# Patient Record
Sex: Male | Born: 1980 | Race: Black or African American | Hispanic: No | Marital: Single | Smoking: Current every day smoker
Health system: Southern US, Community
[De-identification: ages and names within clinical notes are randomized; demographics above are authoritative.]

---

## 2009-05-29 ENCOUNTER — Emergency Department: Payer: Self-pay | Admitting: Emergency Medicine

## 2009-06-05 ENCOUNTER — Emergency Department: Payer: Self-pay | Admitting: Emergency Medicine

## 2013-08-20 ENCOUNTER — Emergency Department (HOSPITAL_COMMUNITY): Payer: Self-pay

## 2013-08-20 ENCOUNTER — Encounter (HOSPITAL_COMMUNITY): Payer: Self-pay | Admitting: Emergency Medicine

## 2013-08-20 ENCOUNTER — Emergency Department (HOSPITAL_COMMUNITY)
Admission: EM | Admit: 2013-08-20 | Discharge: 2013-08-20 | Disposition: A | Discharge status: Home / Self Care | Payer: Self-pay | Attending: Emergency Medicine | Admitting: Emergency Medicine

## 2013-08-20 DIAGNOSIS — S82839A Other fracture of upper and lower end of unspecified fibula, initial encounter for closed fracture: Secondary | ICD-10-CM | POA: Insufficient documentation

## 2013-08-20 DIAGNOSIS — R296 Repeated falls: Secondary | ICD-10-CM | POA: Insufficient documentation

## 2013-08-20 DIAGNOSIS — F17200 Nicotine dependence, unspecified, uncomplicated: Secondary | ICD-10-CM | POA: Insufficient documentation

## 2013-08-20 DIAGNOSIS — S82409A Unspecified fracture of shaft of unspecified fibula, initial encounter for closed fracture: Secondary | ICD-10-CM

## 2013-08-20 DIAGNOSIS — Y9239 Other specified sports and athletic area as the place of occurrence of the external cause: Secondary | ICD-10-CM | POA: Insufficient documentation

## 2013-08-20 DIAGNOSIS — Y9361 Activity, american tackle football: Secondary | ICD-10-CM | POA: Insufficient documentation

## 2013-08-20 DIAGNOSIS — Y92838 Other recreation area as the place of occurrence of the external cause: Secondary | ICD-10-CM

## 2013-08-20 MED ORDER — IBUPROFEN 800 MG PO TABS
800.0000 mg | ORAL_TABLET | Freq: Once | ORAL | Status: AC
  Administered 2013-08-20: 800 mg via ORAL
  Filled 2013-08-20: qty 1

## 2013-08-20 MED ORDER — OXYCODONE-ACETAMINOPHEN 5-325 MG PO TABS
1.0000 | ORAL_TABLET | Freq: Once | ORAL | Status: AC
  Administered 2013-08-20: 1 via ORAL
  Filled 2013-08-20: qty 1

## 2013-08-20 MED ORDER — OXYCODONE-ACETAMINOPHEN 5-325 MG PO TABS: 1.0000 | ORAL_TABLET | Freq: Four times a day (QID) | ORAL | Status: DC | PRN

## 2013-08-20 MED ORDER — IBUPROFEN 800 MG PO TABS: 800.0000 mg | ORAL_TABLET | Freq: Three times a day (TID) | ORAL | Status: DC

## 2013-08-20 NOTE — ED Notes (Signed)
Pt reports fell playing football yesterday.  C/O pain to left knee.

## 2013-08-20 NOTE — Discharge Instructions (Signed)
You have a fracture of your fibula just below your left knee. It is important that you keep your leg elevated above your waist. Please apply ice. Please use crutches when up and about. Please see Dr. Romeo AppleHarrison for evaluation and management of this fracture as sone as possible. Please use ibuprofen 3 times daily with food. Use Percocet for pain if needed. This medication may cause drowsiness, please use with caution.

## 2013-08-20 NOTE — ED Provider Notes (Signed)
CSN: [id number]     Arrival date & time 08/20/13  1156 History  This chart was scribed for Tony QualeHobson Abegail Kloeppel, PA-C, working with Donnetta HutchingBrian Cook, MD, by [facility] ED Scribe. This patient was seen in room APFT20/APFT20 and the patient's care was started at 1:19 PM.   Chief Complaint  Patient presents with  . Knee Pain    The history is provided by the patient. No language interpreter was used.    HPI Comments: Tony Fields is a 33 y.o. male who presents to the Emergency Department complaining of a left knee injury that occurred yesterday. He states that the injury occurred while playing football, when he was coming down from a jump, and he fell, twisting his left knee. He is complaining of constant, moderate pain onset after the injury. He reports an associated gradual onset of left knee swelling. He states that he did not sustain any other injuries. He denies any previous operations or procedures. He states that he is not on any anticoagulants.    History reviewed. No pertinent past medical history. History reviewed. No pertinent past surgical history. No family history on file. History  Substance Use Topics  . Smoking status: Current Every Day Smoker  . Smokeless tobacco: Not on file  . Alcohol Use: Yes     Comment: daily    Review of Systems  Musculoskeletal: Positive for arthralgias (left knee) and joint swelling (left knee). Negative for back pain and neck pain.  Neurological: Negative for syncope and headaches.  All other systems reviewed and are negative.  Allergies  Pineapple  Home Medications   Prior to Admission medications   Not on File   Triage Vitals: BP 142/84  Pulse 84  Temp(Src) 98 F (36.7 C) (Oral)  Resp 18  SpO2 99%  Physical Exam  Nursing note and vitals reviewed. Constitutional: He is oriented to person, place, and time. He appears well-developed and well-nourished. No distress.  HENT:  Head: Normocephalic and atraumatic.  Eyes: EOM are normal.   Neck: Neck supple. No tracheal deviation present.  Cardiovascular: Normal rate and regular rhythm.   Pulmonary/Chest: Effort normal and breath sounds normal. No respiratory distress.  Musculoskeletal: He exhibits tenderness.  LLE: No deformity of the quadriceps area. Moderate effusion of the left knee. Patella is midline. No deformity of the anterior tibial tuberosity. Tenderness to the posterior knee and fibula area. No tenderness to tibia. Full ROM of the ankle. Achilles tendon is intact.  Neurological: He is alert and oriented to person, place, and time.  Skin: Skin is warm and dry.  Psychiatric: He has a normal mood and affect. His behavior is normal.    ED Course FRACTURE CARE: Patient was playing football on yesterday may 16 when he fell and injured the left knee and leg. The patient sustained a fracture of the fibula at the proximal area.  Patient identified by arm band. Permission for the procedure given by the patient. The fracture was reviewed with the patient and explained in terms which he understood. Questions were answered. The patient was fitted with a knee immobilizer. Crutches were ordered. An ice pack was provided. Prescription for Percocet and ibuprofen was given to the patient. Patient tolerated the procedure without problem.   Procedures (including critical care time)  DIAGNOSTIC STUDIES: Oxygen Saturation is 99% on RA, normal by my interpretation.    COORDINATION OF CARE: 1:25 PM- Discussed radiology findings, indicating a left fibular fracture. Pt advised of plan for treatment and pt agrees.  Labs Review Labs Reviewed - No data to display  Imaging Review Dg Knee Complete 4 Views Left  08/20/2013   CLINICAL DATA:  Football injury  EXAM: LEFT KNEE - COMPLETE 4+ VIEW  COMPARISON:  None.  FINDINGS: Acute minimally displaced fracture of the proximal fibula extends into the articulation between the fibula and tibia. Patella, femur, and tibia are intact.  IMPRESSION:  Minimally displaced acute fibular fracture.   Electronically Signed   By: Tony Fields  Tony FieldsD.   On: 08/20/2013 13:03     EKG Interpretation None      MDM X-ray of the left knee reveals a minimally displaced fracture of the proximal fibula. Patient was fitted with a knee immobilizer, crutches, and ice. Prescription for Percocet one every 6 hours and ibuprofen 1 tab 3 times daily given to the patient. Patient will followup with Dr. Romeo Fields. He is invited to return to the emergency department if any changes or problems.    Final diagnoses:  Fibula fracture    **I have reviewed nursing notes, vital signs, and all appropriate lab and imaging results for this patient.*   **I personally performed the services described in this documentation, which was scribed in my presence. The recorded information has been reviewed and is accurate.Kathie Dike*  Tahnee Cifuentes M Merrick Maggio, PA-C 08/21/13 2034

## 2013-08-24 ENCOUNTER — Ambulatory Visit (INDEPENDENT_AMBULATORY_CARE_PROVIDER_SITE_OTHER): Payer: Self-pay | Admitting: Orthopedic Surgery

## 2013-08-24 ENCOUNTER — Encounter: Payer: Self-pay | Admitting: Orthopedic Surgery

## 2013-08-24 VITALS — BP 157/98 | Ht 75.0 in

## 2013-08-24 DIAGNOSIS — S8990XA Unspecified injury of unspecified lower leg, initial encounter: Secondary | ICD-10-CM

## 2013-08-24 DIAGNOSIS — S99929A Unspecified injury of unspecified foot, initial encounter: Secondary | ICD-10-CM

## 2013-08-24 DIAGNOSIS — S99919A Unspecified injury of unspecified ankle, initial encounter: Secondary | ICD-10-CM

## 2013-08-24 NOTE — Patient Instructions (Addendum)
Remove brace 3 times a day and bend knee 20 times each time OOW note

## 2013-08-24 NOTE — Progress Notes (Signed)
Patient ID: Tony RileyJason L Fields, male   DOB: 08/24/1980, 33 y.o.   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 He jumped landed felt lateral knee pain couldn't walk for a couple of days had acute lateral knee pain which has gotten a little bit better with ibuprofen oxycodone his been icing and wearing a knee brace  He has a clean surgical history no medical problems no current medications other than stated for this injury has no allergies he does have a family history of heart disease heart attack hypertension stroke and diabetes his family history indicates mother and father and siblings are alive there is a family history as stated of diabetes  System review active dental issues swelling of the leg related to the injury heartburn. Joint and limb pain related to the injury. Psoriasis. Balance problems numbness tingling  Vital signs: BP 157/98  Ht 6\' 3"  (1.905 m)   General the patient is well-developed and well-nourished grooming and hygiene are normal Oriented x3 Mood and affect normal Ambulation supported by crutches and by knee immobilizer with partial weightbearing Inspection of the left knee reveals joint effusion decreased range of motion only to 20 passive motion and pain doesn't have tenderness around the knee joint over the fibula the knee feels stable is normal skin is clean dry and intact   Cardiovascular exam is normal Sensory exam normal  Fibula fracture proximal with knee effusion  Continue ice start range of motion exercises continue brace and return in a week reexamination

## 2013-08-30 NOTE — ED Provider Notes (Signed)
Medical screening examination/treatment/procedure(s) were performed by non-physician practitioner and as supervising physician I was immediately available for consultation/collaboration.   EKG Interpretation None       Treyton Slimp, MD 08/30/13 1812 

## 2013-08-31 ENCOUNTER — Encounter: Payer: Self-pay | Admitting: Orthopedic Surgery

## 2013-08-31 ENCOUNTER — Ambulatory Visit (INDEPENDENT_AMBULATORY_CARE_PROVIDER_SITE_OTHER): Payer: Self-pay | Admitting: Orthopedic Surgery

## 2013-08-31 VITALS — BP 151/98 | Ht 75.0 in | Wt 220.0 lb

## 2013-08-31 DIAGNOSIS — M25462 Effusion, left knee: Secondary | ICD-10-CM | POA: Insufficient documentation

## 2013-08-31 DIAGNOSIS — M25469 Effusion, unspecified knee: Secondary | ICD-10-CM

## 2013-08-31 DIAGNOSIS — S99919A Unspecified injury of unspecified ankle, initial encounter: Secondary | ICD-10-CM

## 2013-08-31 DIAGNOSIS — S82839A Other fracture of upper and lower end of unspecified fibula, initial encounter for closed fracture: Secondary | ICD-10-CM

## 2013-08-31 DIAGNOSIS — S82832A Other fracture of upper and lower end of left fibula, initial encounter for closed fracture: Secondary | ICD-10-CM

## 2013-08-31 DIAGNOSIS — S8990XA Unspecified injury of unspecified lower leg, initial encounter: Secondary | ICD-10-CM

## 2013-08-31 DIAGNOSIS — S99929A Unspecified injury of unspecified foot, initial encounter: Secondary | ICD-10-CM

## 2013-08-31 MED ORDER — OXYCODONE-ACETAMINOPHEN 5-325 MG PO TABS: 1.0000 | ORAL_TABLET | Freq: Four times a day (QID) | ORAL | Status: DC | PRN

## 2013-08-31 NOTE — Patient Instructions (Addendum)
Brace off   Take it easy for 2 weeks   Return to activities as tolerated   Brace is fine

## 2013-08-31 NOTE — Progress Notes (Signed)
Patient ID: GOLAN SEGO, male   DOB: 02/28/1981, 33 y.o.   MRN: [id number]  Chief Complaint  Patient presents with  . Follow-up    one week recheck left fibular fracture DOI 08/19/13    Encounter Diagnoses  Name Primary?  . Knee injury Yes  . Fracture of left proximal fibula   . Effusion of knee joint, left     Status post left proximal fibular fracture secondary to football injury. Treated with immobilization and Percocet and crutches.  We started him on a home exercise program which she has done well with.  BP 151/98  Ht 6\' 3"  (1.905 m)  Wt 220 lb (99.791 kg)  BMI 27.50 kg/m2 General appearance is normal, the patient is alert and oriented x3 with normal mood and affect.  Today his knee looks good he does have an effusion which is needing to be drained. His knee flexion is returned about 120 he has good extension. Collateral ligaments are stable has some mild tenderness over the fibula   Aspiration left knee  Verbal consent  Time out  Lateral approach  Alcohol prep.  Ethyl chloride anesthesia  Needle was introduced through the lateral suprapatellar approach.  We aspirated 30 cc of red tinged fluid  No complications

## 2014-12-04 IMAGING — CR DG KNEE COMPLETE 4+V*L*
4 series · 4 of 4 positions shown · non-contrast
Comparison: None.

CLINICAL DATA: Football injury

EXAM:
LEFT KNEE - COMPLETE 4+ VIEW

[view not recorded (1 of 4)]
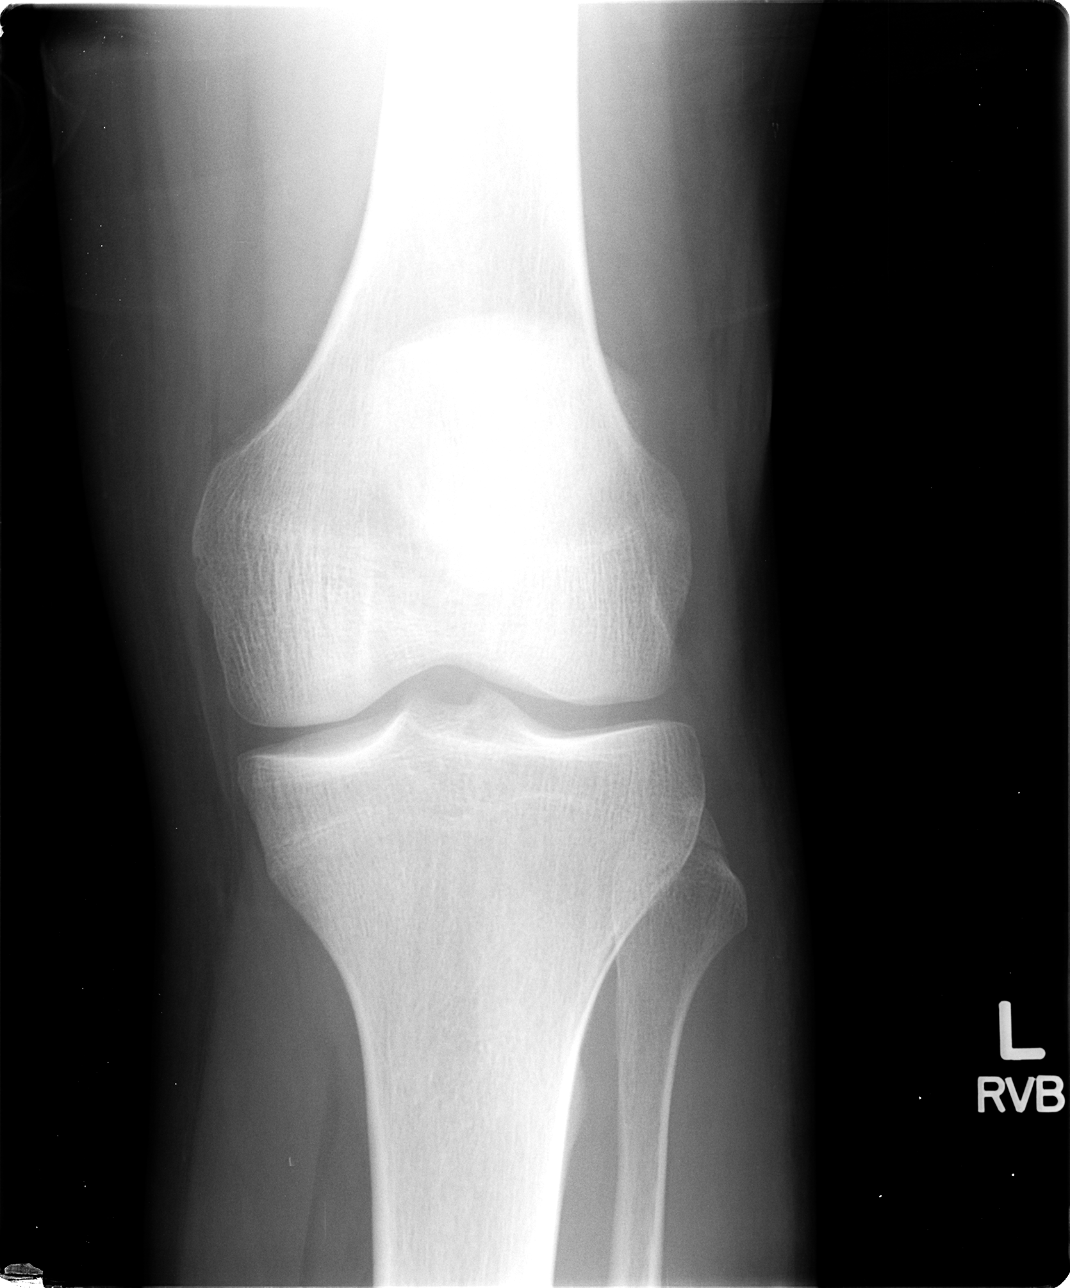

[view not recorded (2 of 4)]
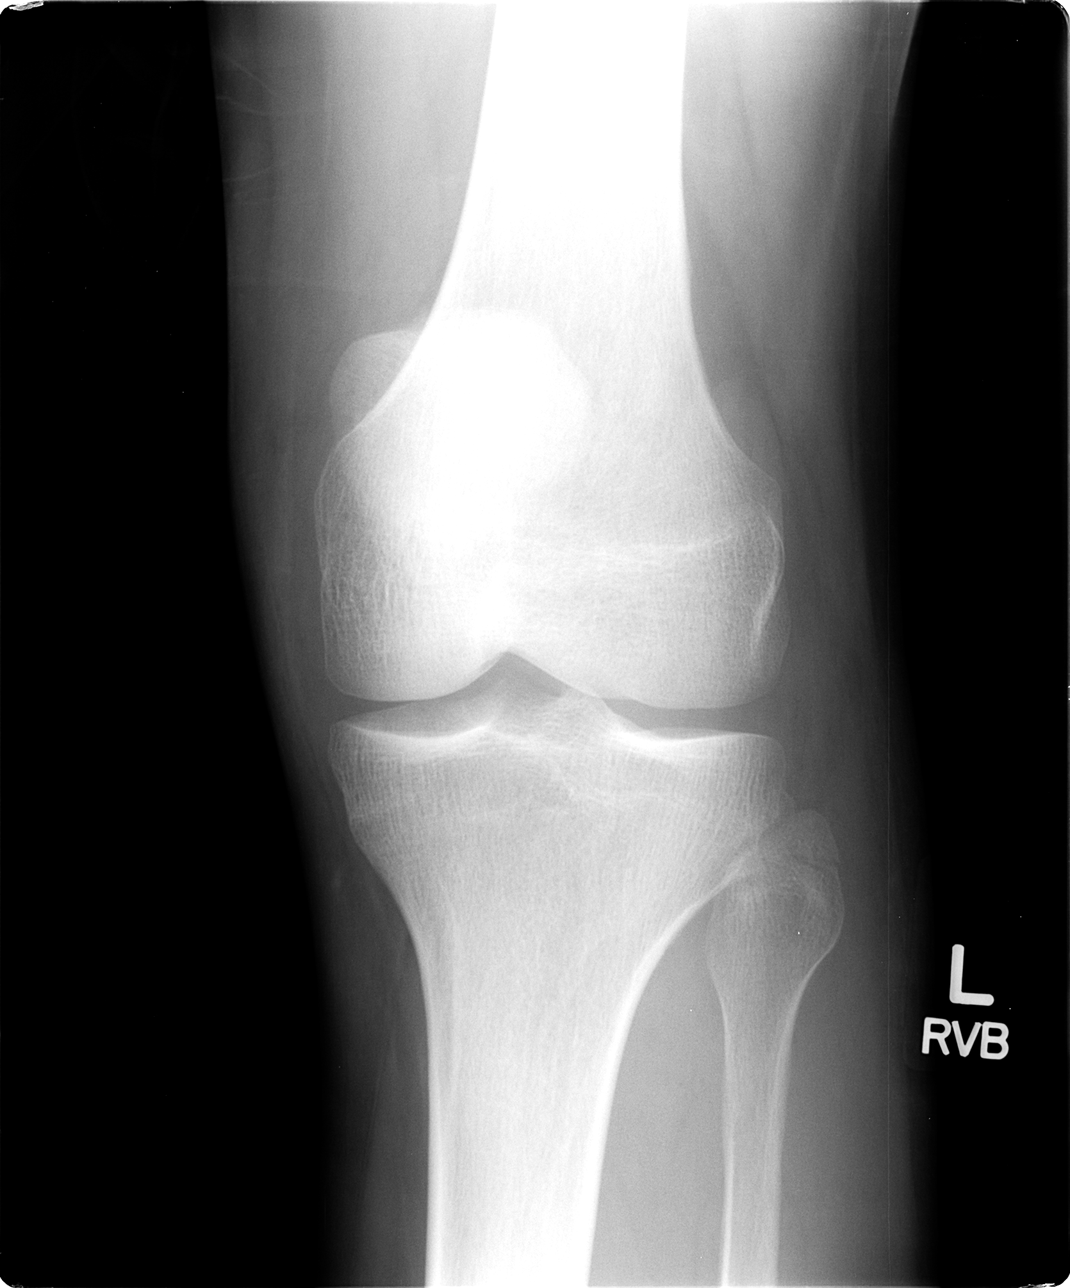

[view not recorded (3 of 4)]
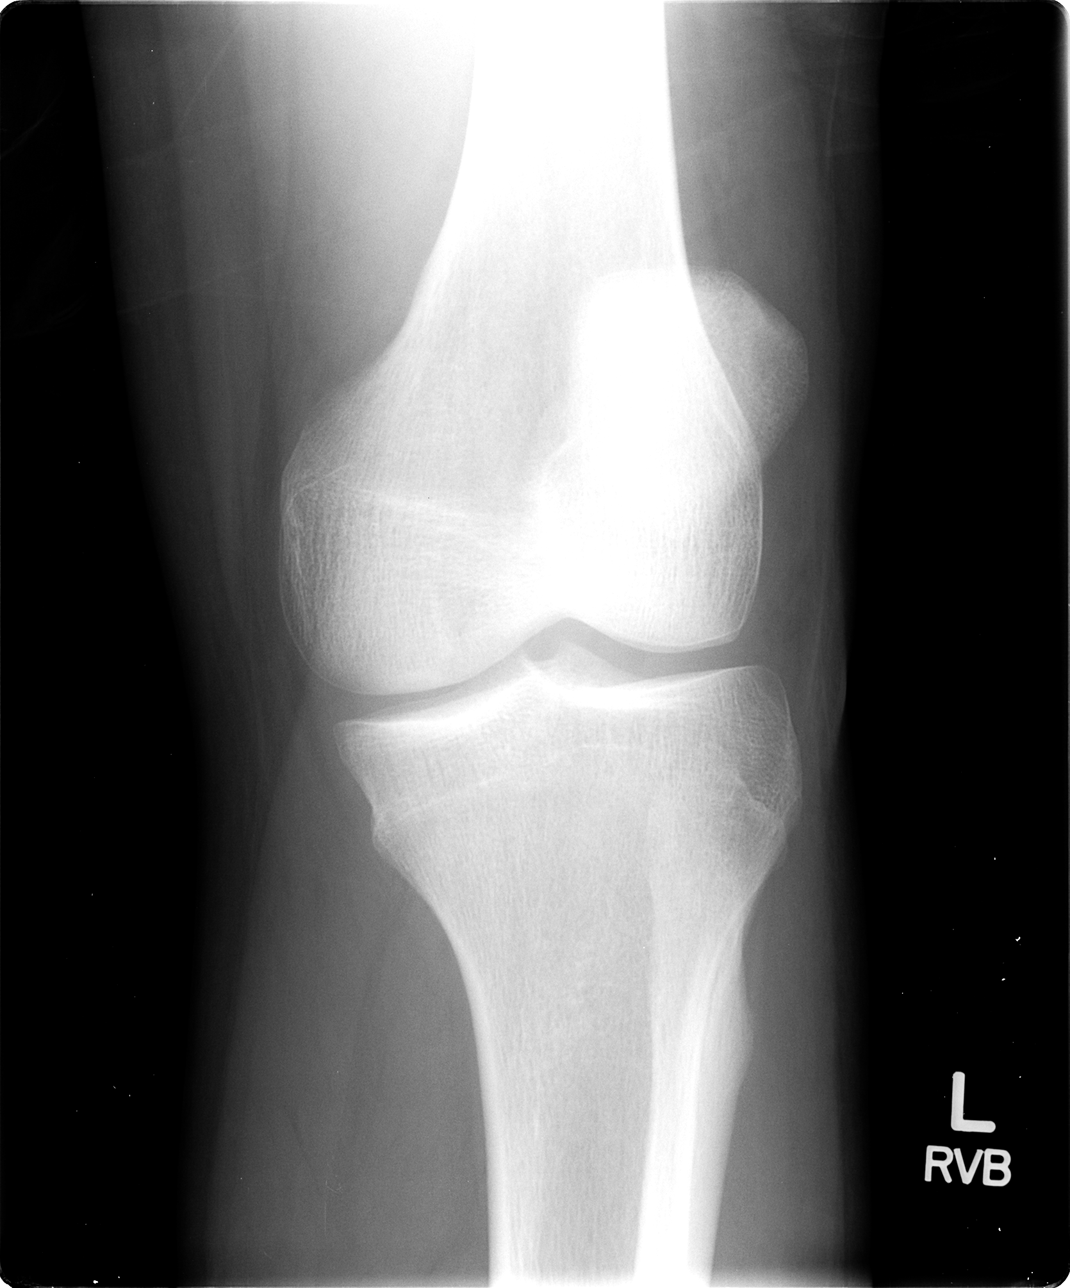

[view not recorded (4 of 4)]
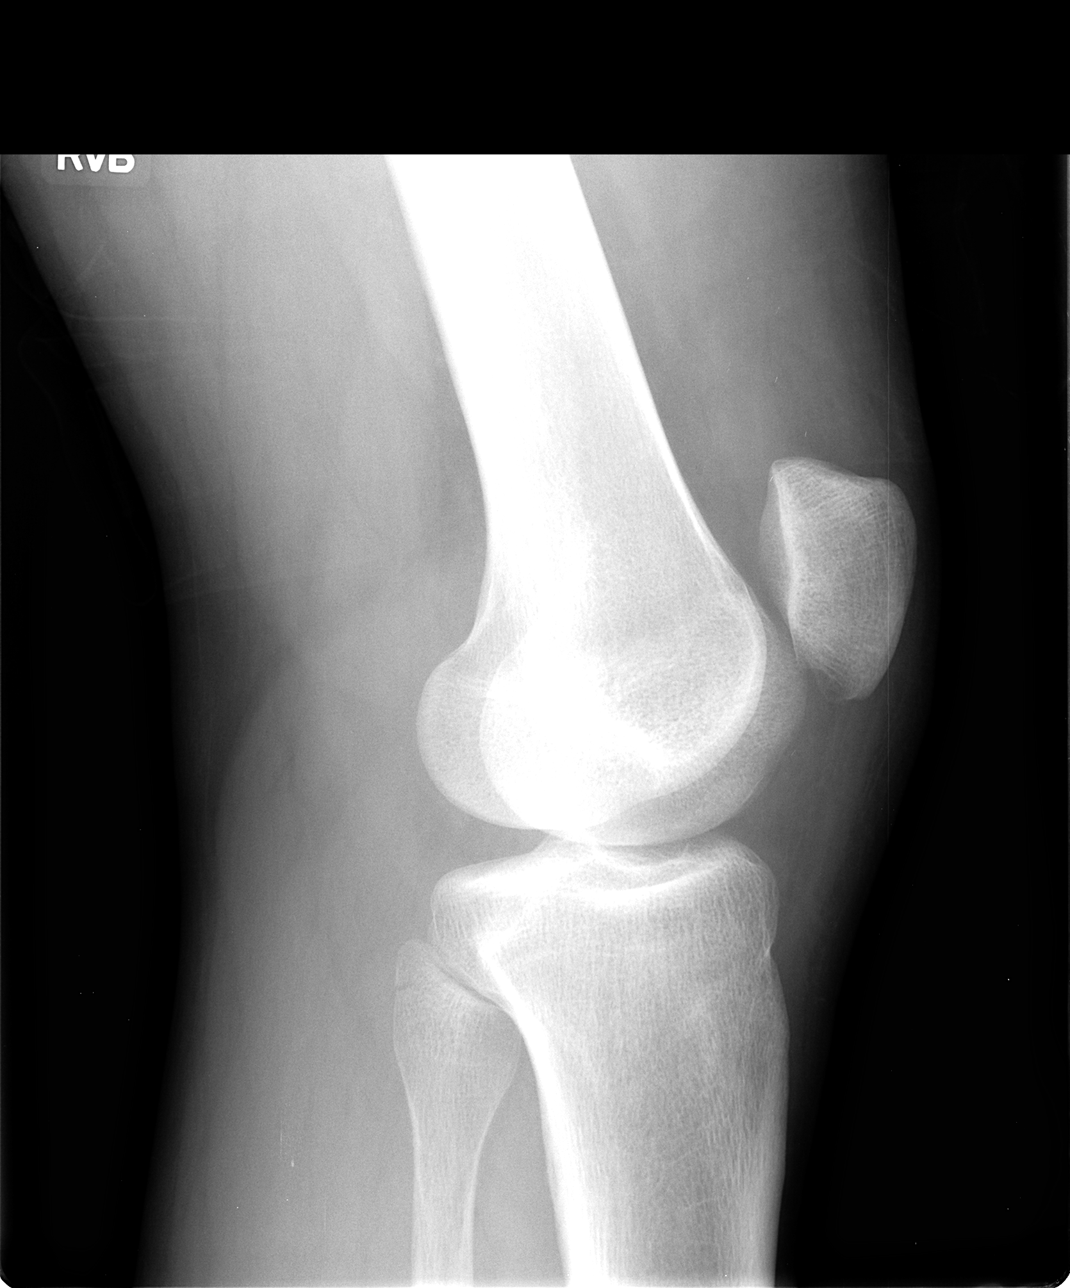

[4 of 4 positions shown; findings below may reference images not displayed]

FINDINGS: Acute minimally displaced fracture of the proximal fibula extends
into the articulation between the fibula and tibia. Patella, femur,
and tibia are intact.
IMPRESSION: Minimally displaced acute fibular fracture.

## 2015-09-08 ENCOUNTER — Emergency Department
Admission: EM | Admit: 2015-09-08 | Discharge: 2015-09-08 | Disposition: A | Discharge status: Home / Self Care | Payer: Self-pay | Attending: Emergency Medicine | Admitting: Emergency Medicine

## 2015-09-08 ENCOUNTER — Encounter: Payer: Self-pay | Admitting: Medical Oncology

## 2015-09-08 DIAGNOSIS — F17200 Nicotine dependence, unspecified, uncomplicated: Secondary | ICD-10-CM | POA: Insufficient documentation

## 2015-09-08 DIAGNOSIS — Z79899 Other long term (current) drug therapy: Secondary | ICD-10-CM | POA: Insufficient documentation

## 2015-09-08 DIAGNOSIS — L02215 Cutaneous abscess of perineum: Secondary | ICD-10-CM | POA: Insufficient documentation

## 2015-09-08 DIAGNOSIS — Z7982 Long term (current) use of aspirin: Secondary | ICD-10-CM | POA: Insufficient documentation

## 2015-09-08 MED ORDER — SULFAMETHOXAZOLE-TRIMETHOPRIM 800-160 MG PO TABS
1.0000 | ORAL_TABLET | Freq: Once | ORAL | Status: AC
  Administered 2015-09-08: 1 via ORAL
  Filled 2015-09-08: qty 1

## 2015-09-08 MED ORDER — BUPIVACAINE HCL (PF) 0.5 % IJ SOLN
INTRAMUSCULAR | Status: AC
  Filled 2015-09-08: qty 30

## 2015-09-08 MED ORDER — FLUORESCEIN SODIUM 1 MG OP STRP
ORAL_STRIP | OPHTHALMIC | Status: AC
  Filled 2015-09-08: qty 1

## 2015-09-08 MED ORDER — EYE WASH OPHTH SOLN
OPHTHALMIC | Status: AC
  Filled 2015-09-08: qty 118

## 2015-09-08 MED ORDER — OXYCODONE-ACETAMINOPHEN 10-325 MG PO TABS: 1.0000 | ORAL_TABLET | Freq: Four times a day (QID) | ORAL | Status: DC | PRN

## 2015-09-08 MED ORDER — OXYCODONE-ACETAMINOPHEN 5-325 MG PO TABS
2.0000 | ORAL_TABLET | Freq: Once | ORAL | Status: AC
  Administered 2015-09-08: 2 via ORAL
  Filled 2015-09-08: qty 2

## 2015-09-08 MED ORDER — NAPROXEN 500 MG PO TABS: 500.0000 mg | ORAL_TABLET | Freq: Two times a day (BID) | ORAL | Status: DC

## 2015-09-08 MED ORDER — TETRACAINE HCL 0.5 % OP SOLN
OPHTHALMIC | Status: AC
  Filled 2015-09-08: qty 2

## 2015-09-08 MED ORDER — SULFAMETHOXAZOLE-TRIMETHOPRIM 800-160 MG PO TABS: 1.0000 | ORAL_TABLET | Freq: Two times a day (BID) | ORAL | Status: DC

## 2015-09-08 NOTE — ED Notes (Signed)
Eye orders put in wrong on pt per PA. Order done on different pt. Charted in error

## 2015-09-08 NOTE — ED Notes (Signed)
Pt reports abscess to groin

## 2015-09-08 NOTE — ED Notes (Signed)
States he developed a possible area to groin 2 days ago  States he used moist heat to area  W/o relief

## 2015-09-08 NOTE — ED Provider Notes (Signed)
[facility] Emergency Department Provider Note   ____________________________________________  Time seen: Approximately 3:24 PM  I have reviewed the triage vital signs and the nursing notes.   HISTORY  Chief Complaint Abscess    HPI Tony Fields is a 35 y.o. male patient was sent for 2 days of increased nodule lesion growth in the perineal area. Patient state the pain has increased especially with ambulation. No palliative measures for this complaint. Patient's redness pain is 8/10. Patient rates pain as "sharp". Patient denies any fever or discharge from the area.   History reviewed. No pertinent past medical history.  Patient Active Problem List   Diagnosis Date Noted  . Effusion of knee joint, left 08/31/2013  . Fracture of left proximal fibula 08/31/2013  . Knee injury 08/31/2013    History reviewed. No pertinent past surgical history.  Current Outpatient Rx  Name  Route  Sig  Dispense  Refill  . aspirin-acetaminophen-caffeine (EXCEDRIN EXTRA STRENGTH) 250-250-65 MG per tablet   Oral   Take 2 tablets by mouth daily as needed (knee pain.).         Marland Kitchen. ibuprofen (ADVIL,MOTRIN) 200 MG tablet   Oral   Take 800 mg by mouth every 6 (six) hours as needed for moderate pain.         Marland Kitchen. ibuprofen (ADVIL,MOTRIN) 800 MG tablet   Oral   Take 1 tablet (800 mg total) by mouth 3 (three) times daily.   21 tablet   0   . naproxen (NAPROSYN) 500 MG tablet   Oral   Take 1 tablet (500 mg total) by mouth 2 (two) times daily with a meal.   20 tablet   00   . naproxen sodium (ANAPROX) 220 MG tablet   Oral   Take 440 mg by mouth daily as needed (back pain.).         Marland Kitchen. oxyCODONE-acetaminophen (PERCOCET) 10-325 MG tablet   Oral   Take 1 tablet by mouth every 6 (six) hours as needed for pain.   20 tablet   0   . oxyCODONE-acetaminophen (PERCOCET/ROXICET) 5-325 MG per tablet   Oral   Take 1 tablet by mouth every 6 (six) hours as needed for  severe pain.   30 tablet   0   . sulfamethoxazole-trimethoprim (BACTRIM DS,SEPTRA DS) 800-160 MG tablet   Oral   Take 1 tablet by mouth 2 (two) times daily.   20 tablet   0     Allergies Pineapple  No family history on file.  Social History Social History  Substance Use Topics  . Smoking status: Current Every Day Smoker  . Smokeless tobacco: None  . Alcohol Use: Yes     Comment: daily    Review of Systems Constitutional: No fever/chills. Patient appears anxious Eyes: No visual changes. ENT: No sore throat. Cardiovascular: Denies chest pain. Respiratory: Denies shortness of breath. Gastrointestinal: No abdominal pain.  No nausea, no vomiting.  No diarrhea.  No constipation. Genitourinary: Negative for dysuria. Musculoskeletal: Negative for back pain. Skin: Negative for rash. Erythematous nodule lesion midline perineal area. Neurological: Negative for headaches, focal weakness or numbness. Allergic/Immunilogical: Pineapple __________________________________________   PHYSICAL EXAM:  VITAL SIGNS: ED Triage Vitals  Enc Vitals Group     BP 09/08/15 1359 149/89 mmHg     Pulse Rate 09/08/15 1359 103     Resp 09/08/15 1359 18     Temp 09/08/15 1359 98.7 F (37.1 C)     Temp Source 09/08/15  1359 Oral     SpO2 09/08/15 1359 100 %     Weight 09/08/15 1359 242 lb (109.77 kg)     Height 09/08/15 1359  (1.905 m)     Head Cir --      Peak Flow --      Pain Score 09/08/15 1401 8     Pain Loc --      Pain Edu? --      Excl. in GC? --     Constitutional: Alert and oriented. Well appearing and in no acute distress. Eyes: Conjunctivae are normal. PERRL. EOMI. Head: Atraumatic. Nose: No congestion/rhinnorhea. Mouth/Throat: Mucous membranes are moist.  Oropharynx non-erythematous. Neck: No stridor.  No cervical spine tenderness to palpation. Cardiovascular: Normal rate, regular rhythm. Grossly normal heart sounds.  Good peripheral circulation. Respiratory: Normal  respiratory effort.  No retractions. Lungs CTAB. Gastrointestinal: Soft and nontender. No distention. No abdominal bruits. No CVA tenderness. Musculoskeletal: No lower extremity tenderness nor edema.  No joint effusions. Neurologic:  Normal speech and language. No gross focal neurologic deficits are appreciated. No gait instability. Skin:  Skin is warm, dry and intact. Fluctuant nausea lesion measures approximately8 centimeters perineal area  Psychiatric: Mood and affect are normal. Speech and behavior are normal.  ____________________________________________   LABS (all labs ordered are listed, but only abnormal results are displayed)  Labs Reviewed  WOUND CULTURE ([facility] ONLY)   ____________________________________________  EKG   ____________________________________________  RADIOLOGY   ____________________________________________   PROCEDURES  Procedure(s) performed: INCISION AND DRAINAGE Performed by: Joni Reining Consent: Verbal consent obtained. Risks and benefits: risks, benefits and alternatives were discussed Type: abscess  Body area:Perineum Anesthesia: local infiltration  Incision was made with a scalpel. 11 blade Sensorcaine Local anesthetic: Sensorcaine Anesthetic total:15 ml  Complexity: complex Blunt dissection to break up loculations  Drainage: purulent  Drainage amount: Large   Packing material: 1/4 in iodoform gauze  Patient tolerance: Patient tolerated the procedure well with no immediate complications.     Critical Care performed: No  ____________________________________________   INITIAL IMPRESSION / ASSESSMENT AND PLAN / ED COURSE  Pertinent labs & imaging results that were available during my care of the patient were reviewed by me and considered in my medical decision making (see chart for details).  Perineum abscess. Patient given discharge care instructions. Patient given a work note for 2 days. Patient advised to take  medication as directed. Patient return in 2 days for wound check. ____________________________________________   FINAL CLINICAL IMPRESSION(S) / ED DIAGNOSES  Final diagnoses:  Abscess of perineum      NEW MEDICATIONS STARTED DURING THIS VISIT:  New Prescriptions   NAPROXEN (NAPROSYN) 500 MG TABLET    Take 1 tablet (500 mg total) by mouth 2 (two) times daily with a meal.   OXYCODONE-ACETAMINOPHEN (PERCOCET) 10-325 MG TABLET    Take 1 tablet by mouth every 6 (six) hours as needed for pain.   SULFAMETHOXAZOLE-TRIMETHOPRIM (BACTRIM DS,SEPTRA DS) 800-160 MG TABLET    Take 1 tablet by mouth 2 (two) times daily.     Note:  This document was prepared using Dragon voice recognition software and may include unintentional dictation errors.    Joni Reining, PA-C 09/08/15 1614  Emily Filbert, MD 09/08/15 2019

## 2015-09-10 ENCOUNTER — Encounter: Payer: Self-pay | Admitting: Emergency Medicine

## 2015-09-10 ENCOUNTER — Emergency Department
Admission: EM | Admit: 2015-09-10 | Discharge: 2015-09-10 | Disposition: A | Discharge status: Home / Self Care | Payer: Self-pay | Attending: Emergency Medicine | Admitting: Emergency Medicine

## 2015-09-10 DIAGNOSIS — K651 Peritoneal abscess: Secondary | ICD-10-CM | POA: Insufficient documentation

## 2015-09-10 DIAGNOSIS — Z79899 Other long term (current) drug therapy: Secondary | ICD-10-CM | POA: Insufficient documentation

## 2015-09-10 DIAGNOSIS — Z7982 Long term (current) use of aspirin: Secondary | ICD-10-CM | POA: Insufficient documentation

## 2015-09-10 DIAGNOSIS — F17200 Nicotine dependence, unspecified, uncomplicated: Secondary | ICD-10-CM | POA: Insufficient documentation

## 2015-09-10 DIAGNOSIS — Z5189 Encounter for other specified aftercare: Secondary | ICD-10-CM

## 2015-09-10 DIAGNOSIS — Z4800 Encounter for change or removal of nonsurgical wound dressing: Secondary | ICD-10-CM | POA: Insufficient documentation

## 2015-09-10 NOTE — ED Notes (Signed)
Reports abscess on buttox, needs packing removed.

## 2015-09-10 NOTE — Discharge Instructions (Signed)
Apply warm compresses to the area 4-5 times a day for 15-20 minutes or soak in a tub of warm water for 20 minutes 4 times today. Continue antibiotic until completely finished. Follow-up with Dr. Doristine CounterBurnett who is the surgeon on call today if any continued problems or no improvement after finishing her antibiotic.

## 2015-09-10 NOTE — ED Provider Notes (Signed)
[facility] Emergency Department Provider Note   ____________________________________________  Time seen: Approximately 1:47 PM  I have reviewed the triage vital signs and the nursing notes.   HISTORY  Chief Complaint Wound Check   HPI Tony Fields is a 35 y.o. male is here for drain removal from his abscess that was opened and drained on 6//17. Patient states he continues to take the anabiotic without any difficulty. He denies any fever or worsening of his symptoms.He rates his pain as 6/10.   History reviewed. No pertinent past medical history.  Patient Active Problem List   Diagnosis Date Noted  . Effusion of knee joint, left 08/31/2013  . Fracture of left proximal fibula 08/31/2013  . Knee injury 08/31/2013    History reviewed. No pertinent past surgical history.  Current Outpatient Rx  Name  Route  Sig  Dispense  Refill  . aspirin-acetaminophen-caffeine (EXCEDRIN EXTRA STRENGTH) 250-250-65 MG per tablet   Oral   Take 2 tablets by mouth daily as needed (knee pain.).         Marland Kitchen ibuprofen (ADVIL,MOTRIN) 200 MG tablet   Oral   Take 800 mg by mouth every 6 (six) hours as needed for moderate pain.         Marland Kitchen ibuprofen (ADVIL,MOTRIN) 800 MG tablet   Oral   Take 1 tablet (800 mg total) by mouth 3 (three) times daily.   21 tablet   0   . naproxen (NAPROSYN) 500 MG tablet   Oral   Take 1 tablet (500 mg total) by mouth 2 (two) times daily with a meal.   20 tablet   00   . naproxen sodium (ANAPROX) 220 MG tablet   Oral   Take 440 mg by mouth daily as needed (back pain.).         Marland Kitchen oxyCODONE-acetaminophen (PERCOCET) 10-325 MG tablet   Oral   Take 1 tablet by mouth every 6 (six) hours as needed for pain.   20 tablet   0   . oxyCODONE-acetaminophen (PERCOCET/ROXICET) 5-325 MG per tablet   Oral   Take 1 tablet by mouth every 6 (six) hours as needed for severe pain.   30 tablet   0   . sulfamethoxazole-trimethoprim (BACTRIM  DS,SEPTRA DS) 800-160 MG tablet   Oral   Take 1 tablet by mouth 2 (two) times daily.   20 tablet   0     Allergies Pineapple  No family history on file.  Social History Social History  Substance Use Topics  . Smoking status: Current Every Day Smoker  . Smokeless tobacco: None  . Alcohol Use: Yes     Comment: daily    Review of Systems Constitutional: No fever/chills Cardiovascular: Denies chest pain. Respiratory: Denies shortness of breath. Gastrointestinal:   No nausea, no vomiting.   Skin: Positive for abscess Neurological: Negative for headaches, focal weakness or numbness.  10-point ROS otherwise negative.  ____________________________________________   PHYSICAL EXAM:  VITAL SIGNS: ED Triage Vitals  Enc Vitals Group     BP 09/10/15 1333 134/100 mmHg     Pulse Rate 09/10/15 1333 84     Resp 09/10/15 1333 18     Temp 09/10/15 1333 98.4 F (36.9 C)     Temp Source 09/10/15 1333 Oral     SpO2 09/10/15 1333 97 %     Weight 09/10/15 1333 222 lb (100.699 kg)     Height 09/10/15 1333  (1.905 m)  Head Cir --      Peak Flow --      Pain Score 09/10/15 1333 6     Pain Loc --      Pain Edu? --      Excl. in GC? --     Constitutional: Alert and oriented. Well appearing and in no acute distress. Eyes: Conjunctivae are normal. PERRL. EOMI. Head: Atraumatic. Nose: No congestion/rhinnorhea. Neck: No stridor.  Respiratory: Normal respiratory effort.  No retractions. Lungs CTAB. Musculoskeletal: Moves upper and lower extremities without any difficulty and normal gait was noted. Neurologic:  Normal speech and language. No gross focal neurologic deficits are appreciated. No gait instability. Skin:  Skin is warm, dry and intact. Abscess left peritoneal area without extending cellulitis. Psychiatric: Mood and affect are normal. Speech and behavior are normal.  ____________________________________________   LABS (all labs ordered are listed, but only  abnormal results are displayed)  Labs Reviewed - No data to display   PROCEDURES  Procedure(s) performed: Iodoform gauze was removed from the area without any difficulty. Patient tolerated well. Dry dressing was placed to the area.   Critical Care performed: No  ____________________________________________   INITIAL IMPRESSION / ASSESSMENT AND PLAN / ED COURSE  Pertinent labs & imaging results that were available during my care of the patient were reviewed by me and considered in my medical decision making (see chart for details).  Patient is continue taking antibiotic until completely finished. He is also instructed to begin using warm compresses or sitting on top of warm water several times a day. He is to follow-up with Dr. Doristine CounterBurnett who is the surgeon on call today. He is made aware that he would need to call and make an appointment if any continued problems.  ____________________________________________   FINAL CLINICAL IMPRESSION(S) / ED DIAGNOSES  Final diagnoses:  Abscess re-check      NEW MEDICATIONS STARTED DURING THIS VISIT:  Discharge Medication List as of 09/10/2015  2:00 PM       Note:  This document was prepared using Dragon voice recognition software and may include unintentional dictation errors.    Tommi RumpsRhonda L Alainna Stawicki, PA-C 09/10/15 1408  Governor Rooksebecca Lord, MD 09/10/15 [phone]

## 2015-09-12 LAB — WOUND CULTURE
CULTURE: NORMAL
Special Requests: NORMAL

## 2015-10-15 ENCOUNTER — Emergency Department
Admission: EM | Admit: 2015-10-15 | Discharge: 2015-10-15 | Disposition: A | Discharge status: Home / Self Care | Payer: No Typology Code available for payment source | Attending: Emergency Medicine | Admitting: Emergency Medicine

## 2015-10-15 ENCOUNTER — Encounter: Payer: Self-pay | Admitting: Emergency Medicine

## 2015-10-15 DIAGNOSIS — Z7982 Long term (current) use of aspirin: Secondary | ICD-10-CM | POA: Insufficient documentation

## 2015-10-15 DIAGNOSIS — N492 Inflammatory disorders of scrotum: Secondary | ICD-10-CM | POA: Insufficient documentation

## 2015-10-15 DIAGNOSIS — F17210 Nicotine dependence, cigarettes, uncomplicated: Secondary | ICD-10-CM | POA: Insufficient documentation

## 2015-10-15 MED ORDER — HYDROCODONE-ACETAMINOPHEN 5-325 MG PO TABS: 1.0000 | ORAL_TABLET | ORAL | Status: AC | PRN

## 2015-10-15 MED ORDER — SULFAMETHOXAZOLE-TRIMETHOPRIM 800-160 MG PO TABS: 1.0000 | ORAL_TABLET | Freq: Two times a day (BID) | ORAL | Status: AC

## 2015-10-15 MED ORDER — CEPHALEXIN 500 MG PO CAPS: 500.0000 mg | ORAL_CAPSULE | Freq: Four times a day (QID) | ORAL | Status: AC

## 2015-10-15 NOTE — Discharge Instructions (Signed)
Warm Epson Salt Soaks multiple times per day. Take the antibiotic until finished. Follow up with surgery. Return to the ER for symptoms that change or worsen if unable to schedule an appointment.

## 2015-10-15 NOTE — ED Notes (Signed)
Pt in via triage with complaints of a "cyst in between his legs."  Pt reports hx of cyst, having it cut and drained in June.  Pt reports being at work today and feeling something wet run down his leg, stating "I think the cyst is back."  Pt [place] the [place] and experiencing some tenderness in that area.  Pt denies any pain at this time.  PT A/Ox4, no immediate distress at this time.

## 2015-10-15 NOTE — ED Provider Notes (Signed)
[facility] Emergency Department Provider Note  ____________________________________________  Time seen: Approximately 9:31 PM  I have reviewed the triage vital signs and the nursing notes.   HISTORY  Chief Complaint Cyst   HPI Tony Fields is a 35 y.o. male who presents to the emergency department for evaluation of a draining cyst in his scrotum. He states that he has had this before and it had to be lanced. He states that the pain started 2 days ago and he took some ibuprofen with relief. He states that he worked today and just before he got off work, he noticed some wetness in that area and then the pain decreased significantly. He denies nausea or fever.  History reviewed. No pertinent past medical history.  Patient Active Problem List   Diagnosis Date Noted  . Effusion of knee joint, left 08/31/2013  . Fracture of left proximal fibula 08/31/2013  . Knee injury 08/31/2013    History reviewed. No pertinent past surgical history.  Current Outpatient Rx  Name  Route  Sig  Dispense  Refill  . aspirin-acetaminophen-caffeine (EXCEDRIN EXTRA STRENGTH) 250-250-65 MG per tablet   Oral   Take 2 tablets by mouth daily as needed (knee pain.).         Marland Kitchen cephALEXin (KEFLEX) 500 MG capsule   Oral   Take 1 capsule (500 mg total) by mouth 4 (four) times daily.   40 capsule   0   . HYDROcodone-acetaminophen (NORCO/VICODIN) 5-325 MG tablet   Oral   Take 1 tablet by mouth every 4 (four) hours as needed.   12 tablet   0   . sulfamethoxazole-trimethoprim (BACTRIM DS,SEPTRA DS) 800-160 MG tablet   Oral   Take 1 tablet by mouth 2 (two) times daily.   20 tablet   0     Allergies Pineapple and Oxycodone  No family history on file.  Social History Social History  Substance Use Topics  . Smoking status: Current Every Day Smoker -- 0.50 packs/day    Types: Cigarettes  . Smokeless tobacco: None  . Alcohol Use: Yes     Comment: daily    Review  of Systems  Constitutional: Negative for fever/chills Respiratory: Negative for shortness of breath. Musculoskeletal: Negative for pain. Skin: Positive for abscess Neurological: Negative for headaches, focal weakness or numbness. ____________________________________________   PHYSICAL EXAM:  VITAL SIGNS: ED Triage Vitals  Enc Vitals Group     BP 10/15/15 2044 145/93 mmHg     Pulse Rate 10/15/15 2044 88     Resp 10/15/15 2044 18     Temp 10/15/15 2044 98.9 F (37.2 C)     Temp Source 10/15/15 2044 Oral     SpO2 10/15/15 2044 97 %     Weight 10/15/15 2044 238 lb (107.956 kg)     Height 10/15/15 2044  (1.905 m)     Head Cir --      Peak Flow --      Pain Score --      Pain Loc --      Pain Edu? --      Excl. in GC? --      Constitutional: Alert and oriented. Well appearing and in no acute distress. Eyes: Conjunctivae are normal. EOMI. Nose: No congestion/rhinnorhea. Mouth/Throat: Mucous membranes are moist.   Neck: No stridor. Lymphatic: Negative for cervical lymphadenopathy. Cardiovascular: Good peripheral circulation. Respiratory: Normal respiratory effort.  No retractions. Musculoskeletal: FROM throughout. Neurologic:  Normal speech and language. No gross  focal neurologic deficits are appreciated. Skin:  Small (dime sized) indurated area noted in the midline of the base of the scrotum with purulent drainage present. No fluctuance noted. No surrounding erythema. The induration is localized to the area that is actively draining. Perineum is soft.  ____________________________________________   LABS (all labs ordered are listed, but only abnormal results are displayed)  Labs Reviewed - No data to display ____________________________________________  EKG   ____________________________________________  RADIOLOGY  Not indicated ____________________________________________   PROCEDURES  Procedure(s) performed:  None ____________________________________________   INITIAL IMPRESSION / ASSESSMENT AND PLAN / ED COURSE  Pertinent labs & imaging results that were available during my care of the patient were reviewed by me and considered in my medical decision making (see chart for details).  He will be advised to take the Bactrim, Keflex, and Norco as prescribed.  He was advised to follow up with surgery for symptoms that are not resolved with the antibiotics or if the area becomes increasingly painful and is no longer draining.  He was also advised to return to the emergency department for symptoms that change or worsen if unable to schedule an appointment.  ____________________________________________   FINAL CLINICAL IMPRESSION(S) / ED DIAGNOSES  Final diagnoses:  Scrotal abscess    New Prescriptions   CEPHALEXIN (KEFLEX) 500 MG CAPSULE    Take 1 capsule (500 mg total) by mouth 4 (four) times daily.   HYDROCODONE-ACETAMINOPHEN (NORCO/VICODIN) 5-325 MG TABLET    Take 1 tablet by mouth every 4 (four) hours as needed.   SULFAMETHOXAZOLE-TRIMETHOPRIM (BACTRIM DS,SEPTRA DS) 800-160 MG TABLET    Take 1 tablet by mouth 2 (two) times daily.     Chinita PesterCari B Jailin Moomaw, FNP 10/15/15 2212  Minna AntisKevin Paduchowski, MD 10/15/15 2332
